# Patient Record
Sex: Female | Born: 1960 | Race: White | Hispanic: No | Marital: Married | Smoking: Former smoker
Health system: Southern US, Community
[De-identification: ages and names within clinical notes are randomized; demographics above are authoritative.]

## PROBLEM LIST (undated history)

## (undated) DIAGNOSIS — D72819 Decreased white blood cell count, unspecified: Secondary | ICD-10-CM

---

## 2018-10-12 ENCOUNTER — Encounter (HOSPITAL_BASED_OUTPATIENT_CLINIC_OR_DEPARTMENT_OTHER): Payer: Self-pay | Admitting: Adult Health

## 2018-10-12 ENCOUNTER — Other Ambulatory Visit: Payer: Self-pay

## 2018-10-12 ENCOUNTER — Emergency Department (HOSPITAL_BASED_OUTPATIENT_CLINIC_OR_DEPARTMENT_OTHER)
Admission: EM | Admit: 2018-10-12 | Discharge: 2018-10-12 | Disposition: A | Discharge status: Home / Self Care | Payer: Commercial Managed Care - PPO | Attending: Emergency Medicine | Admitting: Emergency Medicine

## 2018-10-12 ENCOUNTER — Emergency Department (HOSPITAL_BASED_OUTPATIENT_CLINIC_OR_DEPARTMENT_OTHER): Payer: Commercial Managed Care - PPO

## 2018-10-12 DIAGNOSIS — Y998 Other external cause status: Secondary | ICD-10-CM | POA: Insufficient documentation

## 2018-10-12 DIAGNOSIS — S022XXA Fracture of nasal bones, initial encounter for closed fracture: Secondary | ICD-10-CM | POA: Diagnosis not present

## 2018-10-12 DIAGNOSIS — S025XXA Fracture of tooth (traumatic), initial encounter for closed fracture: Secondary | ICD-10-CM | POA: Diagnosis not present

## 2018-10-12 DIAGNOSIS — S0993XA Unspecified injury of face, initial encounter: Secondary | ICD-10-CM | POA: Diagnosis present

## 2018-10-12 DIAGNOSIS — S50311A Abrasion of right elbow, initial encounter: Secondary | ICD-10-CM | POA: Diagnosis not present

## 2018-10-12 DIAGNOSIS — S80211A Abrasion, right knee, initial encounter: Secondary | ICD-10-CM | POA: Insufficient documentation

## 2018-10-12 DIAGNOSIS — S60512A Abrasion of left hand, initial encounter: Secondary | ICD-10-CM | POA: Insufficient documentation

## 2018-10-12 DIAGNOSIS — S50312A Abrasion of left elbow, initial encounter: Secondary | ICD-10-CM | POA: Diagnosis not present

## 2018-10-12 DIAGNOSIS — W0110XA Fall on same level from slipping, tripping and stumbling with subsequent striking against unspecified object, initial encounter: Secondary | ICD-10-CM | POA: Diagnosis not present

## 2018-10-12 DIAGNOSIS — Z23 Encounter for immunization: Secondary | ICD-10-CM | POA: Insufficient documentation

## 2018-10-12 DIAGNOSIS — Y929 Unspecified place or not applicable: Secondary | ICD-10-CM | POA: Insufficient documentation

## 2018-10-12 DIAGNOSIS — S80212A Abrasion, left knee, initial encounter: Secondary | ICD-10-CM | POA: Diagnosis not present

## 2018-10-12 DIAGNOSIS — S52125A Nondisplaced fracture of head of left radius, initial encounter for closed fracture: Secondary | ICD-10-CM | POA: Insufficient documentation

## 2018-10-12 DIAGNOSIS — S01512A Laceration without foreign body of oral cavity, initial encounter: Secondary | ICD-10-CM | POA: Insufficient documentation

## 2018-10-12 DIAGNOSIS — Y9302 Activity, running: Secondary | ICD-10-CM | POA: Diagnosis not present

## 2018-10-12 DIAGNOSIS — T07XXXA Unspecified multiple injuries, initial encounter: Secondary | ICD-10-CM

## 2018-10-12 HISTORY — DX: Decreased white blood cell count, unspecified: D72.819

## 2018-10-12 MED ORDER — TETANUS-DIPHTH-ACELL PERTUSSIS 5-2.5-18.5 LF-MCG/0.5 IM SUSP
0.5000 mL | Freq: Once | INTRAMUSCULAR | Status: AC
  Administered 2018-10-12: 0.5 mL via INTRAMUSCULAR
  Filled 2018-10-12: qty 0.5

## 2018-10-12 MED ORDER — BACITRACIN ZINC 500 UNIT/GM EX OINT
1.0000 "application " | TOPICAL_OINTMENT | Freq: Two times a day (BID) | CUTANEOUS | Status: DC
  Filled 2018-10-12: qty 28.35

## 2018-10-12 NOTE — ED Provider Notes (Signed)
[facility] EMERGENCY DEPARTMENT Provider Note   CSN: [id number] Arrival date & time: 10/12/18  1149    History   Chief Complaint Chief Complaint  Patient presents with  . Fall    HPI Christina Ibarra is a 58 y.o. female.     58-year-old female with past medical history including chronic leukopenia who presents with fall and facial injuries.  Just prior to arrival, the patient was running and she tripped and fell forward, striking her face, knees, and arms.  She did not lose consciousness.  Her nose bled initially but has stopped.  She reports pain in her nose as well as mild pain in her left wrist and proximal forearm.  She has been able to ambulate with no problems since the event.  No vomiting or confusion.  Unknown last tetanus vaccination.  The history is provided by the patient.  Fall    Past Medical History:  Diagnosis Date  . Chronic leukopenia     There are no active problems to display for this patient.   History reviewed. No pertinent surgical history.   OB History   No obstetric history on file.      Home Medications    Prior to Admission medications   Medication Sig Start Date End Date Taking? Authorizing Provider  acetaminophen (TYLENOL) 325 MG tablet Take 650 mg by mouth every 6 (six) hours as needed.   Yes [provider]    Family History History reviewed. No pertinent family history.  Social History Social History   Tobacco Use  . Smoking status: Former Games developermoker  . Smokeless tobacco: Never Used  Substance Use Topics  . Alcohol use: Never    Frequency: Never  . Drug use: Never     Allergies   Patient has no known allergies.   Review of Systems Review of Systems All other systems reviewed and are negative except that which was mentioned in HPI   Physical Exam Updated Vital Signs BP (!) 165/89   Pulse 66   Temp 98.4 F (36.9 C) (Oral)   Resp 18   Ht 5\' 2"  (1.575 m)   Wt 57.5 kg   SpO2 99%   BMI 23.19 kg/m   Physical Exam Vitals signs and nursing note reviewed.  Constitutional:      General: She is not in acute distress.    Appearance: She is well-developed.  HENT:     Head: Normocephalic.     Comments: Swelling and ecchymosis nasal bridge w/ dried blood in nares; small laceration under upper lip involving mucosa only; chipped central bottom tooth and chipped right central incisor with no exposure of dentin or pulp    Right Ear: Tympanic membrane and ear canal normal.     Left Ear: Tympanic membrane and ear canal normal.     Mouth/Throat:     Mouth: Mucous membranes are moist.     Pharynx: Oropharynx is clear.  Eyes:     Conjunctiva/sclera: Conjunctivae normal.     Pupils: Pupils are equal, round, and reactive to light.  Neck:     Musculoskeletal: Neck supple.  Cardiovascular:     Pulses: Normal pulses.  Pulmonary:     Effort: Pulmonary effort is normal.  Musculoskeletal: Normal range of motion.        General: No deformity.     Comments: Mild tenderness radial side of L wrist and over L proximal forearm, normal ROM w/ normal pronation/supination  Skin:    General: Skin is warm  and dry.     Comments: Small insect bite L neck; abrasions b/l knees, b/l elbows, L palm on thenar eminence  Neurological:     Mental Status: She is alert and oriented to person, place, and time.  Psychiatric:        Judgment: Judgment normal.      ED Treatments / Results  Labs (all labs ordered are listed, but only abnormal results are displayed) Labs Reviewed - No data to display  EKG None  Radiology Dg Elbow Complete Left  Result Date: 10/12/2018 CLINICAL DATA:  Fall, pain EXAM: LEFT WRIST - COMPLETE 3+ VIEW; LEFT ELBOW - COMPLETE 3+ VIEW COMPARISON:  None. FINDINGS: No fracture or dislocation of the left wrist. The carpus is normally aligned. Joint spaces are well preserved. There is a subtle cortical irregularity of the left radial neck best appreciated on frontal view with a large associated  elbow joint effusion. No other evidence of fracture. The joint spaces are well preserved. IMPRESSION: 1. No fracture or dislocation of the left wrist. The carpus is normally aligned. Joint spaces are well preserved. 2. There is a subtle cortical irregularity of the left radial neck best appreciated on frontal view with a large associated elbow joint effusion. Findings are consistent with a subtle fracture of the radial neck. No other evidence of fracture. The joint spaces are well preserved. Electronically Signed   By: Eddie Candle M.D.   On: 10/12/2018 14:33   Dg Wrist Complete Left  Result Date: 10/12/2018 CLINICAL DATA:  Fall, pain EXAM: LEFT WRIST - COMPLETE 3+ VIEW; LEFT ELBOW - COMPLETE 3+ VIEW COMPARISON:  None. FINDINGS: No fracture or dislocation of the left wrist. The carpus is normally aligned. Joint spaces are well preserved. There is a subtle cortical irregularity of the left radial neck best appreciated on frontal view with a large associated elbow joint effusion. No other evidence of fracture. The joint spaces are well preserved. IMPRESSION: 1. No fracture or dislocation of the left wrist. The carpus is normally aligned. Joint spaces are well preserved. 2. There is a subtle cortical irregularity of the left radial neck best appreciated on frontal view with a large associated elbow joint effusion. Findings are consistent with a subtle fracture of the radial neck. No other evidence of fracture. The joint spaces are well preserved. Electronically Signed   By: Eddie Candle M.D.   On: 10/12/2018 14:33   Ct Head Wo Contrast  Result Date: 10/12/2018 CLINICAL DATA:  Fall, head and facial injury, headache EXAM: CT HEAD WITHOUT CONTRAST CT MAXILLOFACIAL WITHOUT CONTRAST CT CERVICAL SPINE WITHOUT CONTRAST TECHNIQUE: Multidetector CT imaging of the head, cervical spine, and maxillofacial structures were performed using the standard protocol without intravenous contrast. Multiplanar CT image  reconstructions of the cervical spine and maxillofacial structures were also generated. COMPARISON:  None. FINDINGS: CT HEAD FINDINGS Brain: No evidence of acute infarction, hemorrhage, hydrocephalus, extra-axial collection or mass lesion/mass effect. 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 Vascular: No hyperdense vessel or unexpected calcification. CT FACIAL BONES FINDINGS Skull: Normal. Negative for fracture or focal lesion. Facial bones: Minimally displaced fractures of the nasal bones (series 7, image 44). No other displaced fractures or dislocations. Sinuses/Orbits: No acute finding. Other: Soft tissue edema of the nose and lips. CT CERVICAL SPINE FINDINGS Alignment: Normal. Skull base and vertebrae: No acute fracture. No primary bone lesion or focal pathologic process. Soft tissues and spinal canal: No prevertebral fluid or swelling. No visible canal hematoma.  Disc levels: Mild multilevel disc space height loss and osteophytosis. Upper chest: Negative. Other: None. IMPRESSION: 1.  No acute intracranial pathology. 2. Minimally displaced fractures of the nasal bones (series 7, image 44). No other displaced fractures or dislocations of the facial bones. 3.  No fracture or static subluxation of the cervical spine. Electronically Signed   By: Lauralyn PrimesAlex  Bibbey M.D.   On: 10/12/2018 13:06   Ct Cervical Spine Wo Contrast  Result Date: 10/12/2018 CLINICAL DATA:  Fall, head and facial injury, headache EXAM: CT HEAD WITHOUT CONTRAST CT MAXILLOFACIAL WITHOUT CONTRAST CT CERVICAL SPINE WITHOUT CONTRAST TECHNIQUE: Multidetector CT imaging of the head, cervical spine, and maxillofacial structures were performed using the standard protocol without intravenous contrast. Multiplanar CT image reconstructions of the cervical spine and maxillofacial structures were also generated. COMPARISON:  None. FINDINGS: CT HEAD FINDINGS Brain: No evidence of acute infarction, hemorrhage, hydrocephalus,  extra-axial collection or mass lesion/mass effect. 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 Vascular: No hyperdense vessel or unexpected calcification. CT FACIAL BONES FINDINGS Skull: Normal. Negative for fracture or focal lesion. Facial bones: Minimally displaced fractures of the nasal bones (series 7, image 44). No other displaced fractures or dislocations. Sinuses/Orbits: No acute finding. Other: Soft tissue edema of the nose and lips. CT CERVICAL SPINE FINDINGS Alignment: Normal. Skull base and vertebrae: No acute fracture. No primary bone lesion or focal pathologic process. Soft tissues and spinal canal: No prevertebral fluid or swelling. No visible canal hematoma. Disc levels: Mild multilevel disc space height loss and osteophytosis. Upper chest: Negative. Other: None. IMPRESSION: 1.  No acute intracranial pathology. 2. Minimally displaced fractures of the nasal bones (series 7, image 44). No other displaced fractures or dislocations of the facial bones. 3.  No fracture or static subluxation of the cervical spine. Electronically Signed   By: Lauralyn PrimesAlex  Bibbey M.D.   On: 10/12/2018 13:06   Ct Maxillofacial Wo Contrast  Result Date: 10/12/2018 CLINICAL DATA:  Fall, head and facial injury, headache EXAM: CT HEAD WITHOUT CONTRAST CT MAXILLOFACIAL WITHOUT CONTRAST CT CERVICAL SPINE WITHOUT CONTRAST TECHNIQUE: Multidetector CT imaging of the head, cervical spine, and maxillofacial structures were performed using the standard protocol without intravenous contrast. Multiplanar CT image reconstructions of the cervical spine and maxillofacial structures were also generated. COMPARISON:  None. FINDINGS: CT HEAD FINDINGS Brain: No evidence of acute infarction, hemorrhage, hydrocephalus, extra-axial collection or mass lesion/mass effect. 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 Vascular: No hyperdense vessel or unexpected calcification. CT FACIAL  BONES FINDINGS Skull: Normal. Negative for fracture or focal lesion. Facial bones: Minimally displaced fractures of the nasal bones (series 7, image 44). No other displaced fractures or dislocations. Sinuses/Orbits: No acute finding. Other: Soft tissue edema of the nose and lips. CT CERVICAL SPINE FINDINGS Alignment: Normal. Skull base and vertebrae: No acute fracture. No primary bone lesion or focal pathologic process. Soft tissues and spinal canal: No prevertebral fluid or swelling. No visible canal hematoma. Disc levels: Mild multilevel disc space height loss and osteophytosis. Upper chest: Negative. Other: None. IMPRESSION: 1.  No acute intracranial pathology. 2. Minimally displaced fractures of the nasal bones (series 7, image 44). No other displaced fractures or dislocations of the facial bones. 3.  No fracture or static subluxation of the cervical spine. Electronically Signed   By: Lauralyn PrimesAlex  Bibbey M.D.   On: 10/12/2018 13:06    Procedures Procedures (including critical care time)  Medications Ordered in ED Medications  bacitracin ointment  1 application (1 application Topical Refused 10/12/18 1408)  Tdap (BOOSTRIX) injection 0.5 mL (0.5 mLs Intramuscular Given 10/12/18 1407)     Initial Impression / Assessment and Plan / ED Course  I have reviewed the triage vital signs and the nursing notes.  Pertinent imaging results that were available during my care of the patient were reviewed by me and considered in my medical decision making (see chart for details).       Well-appearing on exam.  CT head and C-spine reassuring.  Maxillofacial CT shows minimally displaced fracture of nasal bones.  Updated tetanus and discussed supportive measures for nasal bone fractures as well as ENT follow-up.  Patient has already contacted dentist for follow-up.  Discussed supportive measures for tooth injuries and mouth laceration.  XR L arm shows possible L radial neck fx, which is c/w exam. Will place in sling  and give ortho f/u info.   Return precautions reviewed.  Final Clinical Impressions(s) / ED Diagnoses   Final diagnoses:  Closed fracture of nasal bone, initial encounter  Multiple abrasions  Closed fracture of tooth, initial encounter  Laceration of internal mouth, initial encounter  Closed nondisplaced fracture of head of left radius, initial encounter    ED Discharge Orders    None       , Ambrose Finlandachel Morgan, MD 10/12/18 [phone]

## 2018-10-12 NOTE — ED Triage Notes (Signed)
While running today pt had a foosh injury as well as hit her face on thoncrete. SHe has significant bruising to her nose and has some broken teeth and a laceration inside her mouth. She also has pain with pronation of left wrist.

## 2020-11-06 IMAGING — DX LEFT WRIST - COMPLETE 3+ VIEW
4 series · 4 of 4 positions shown · non-contrast
Comparison: None.

CLINICAL DATA: Fall, pain

EXAM:
LEFT WRIST - COMPLETE 3+ VIEW; LEFT ELBOW - COMPLETE 3+ VIEW

[wrist ap]
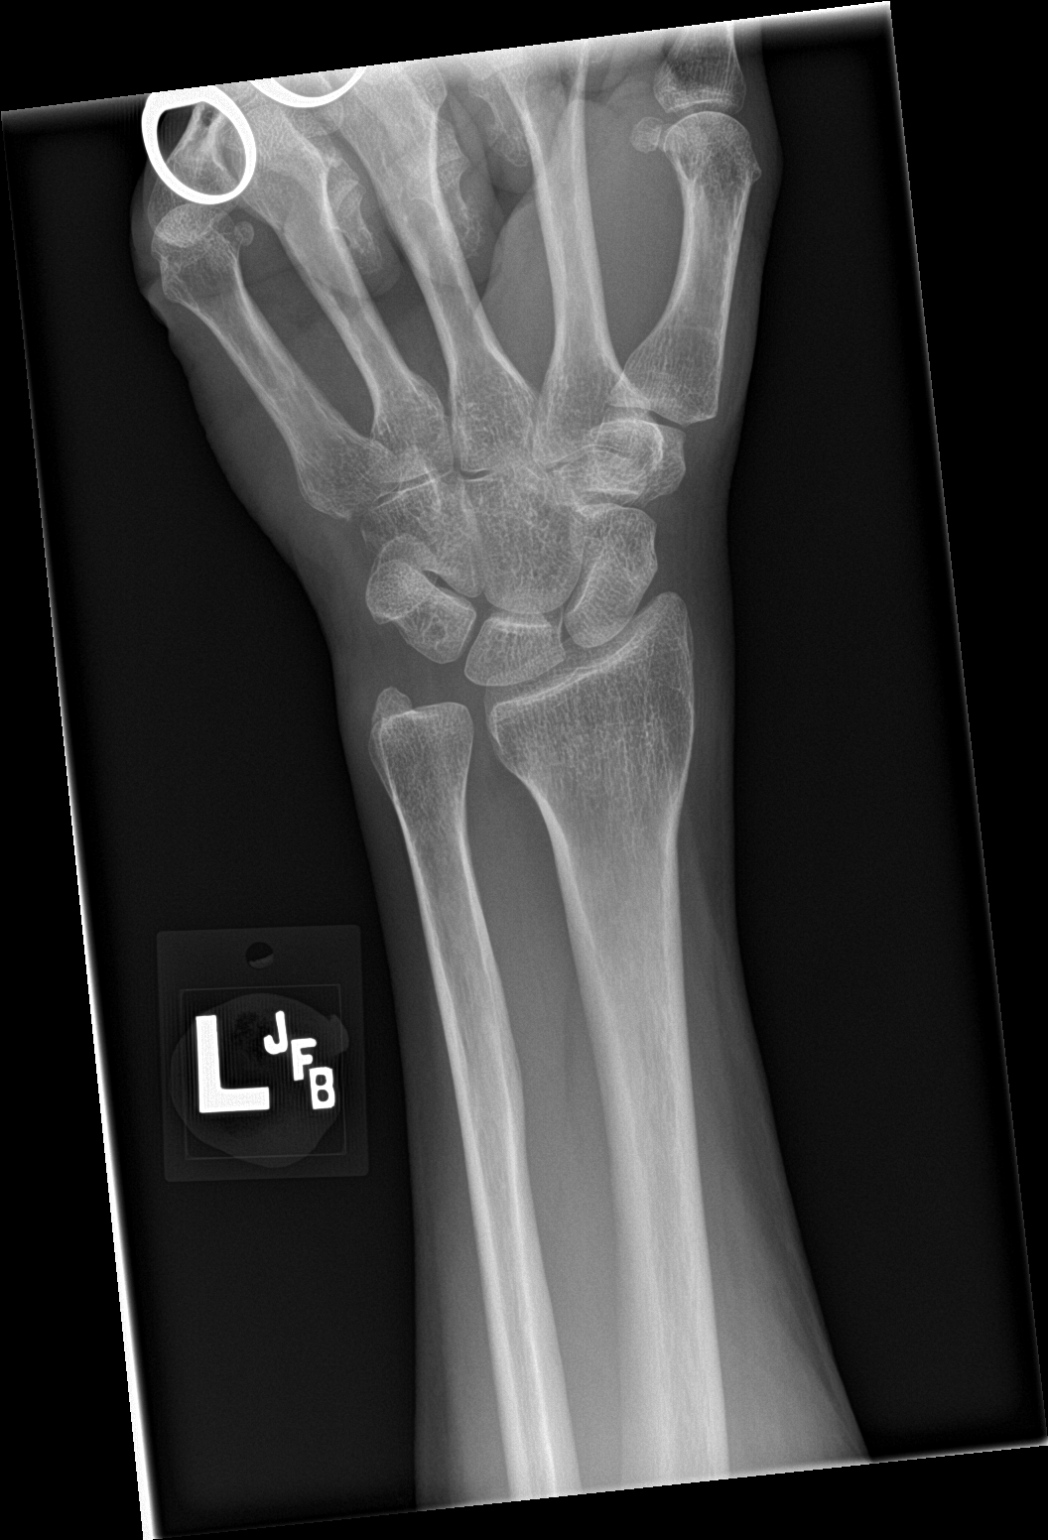

[wrist obl]
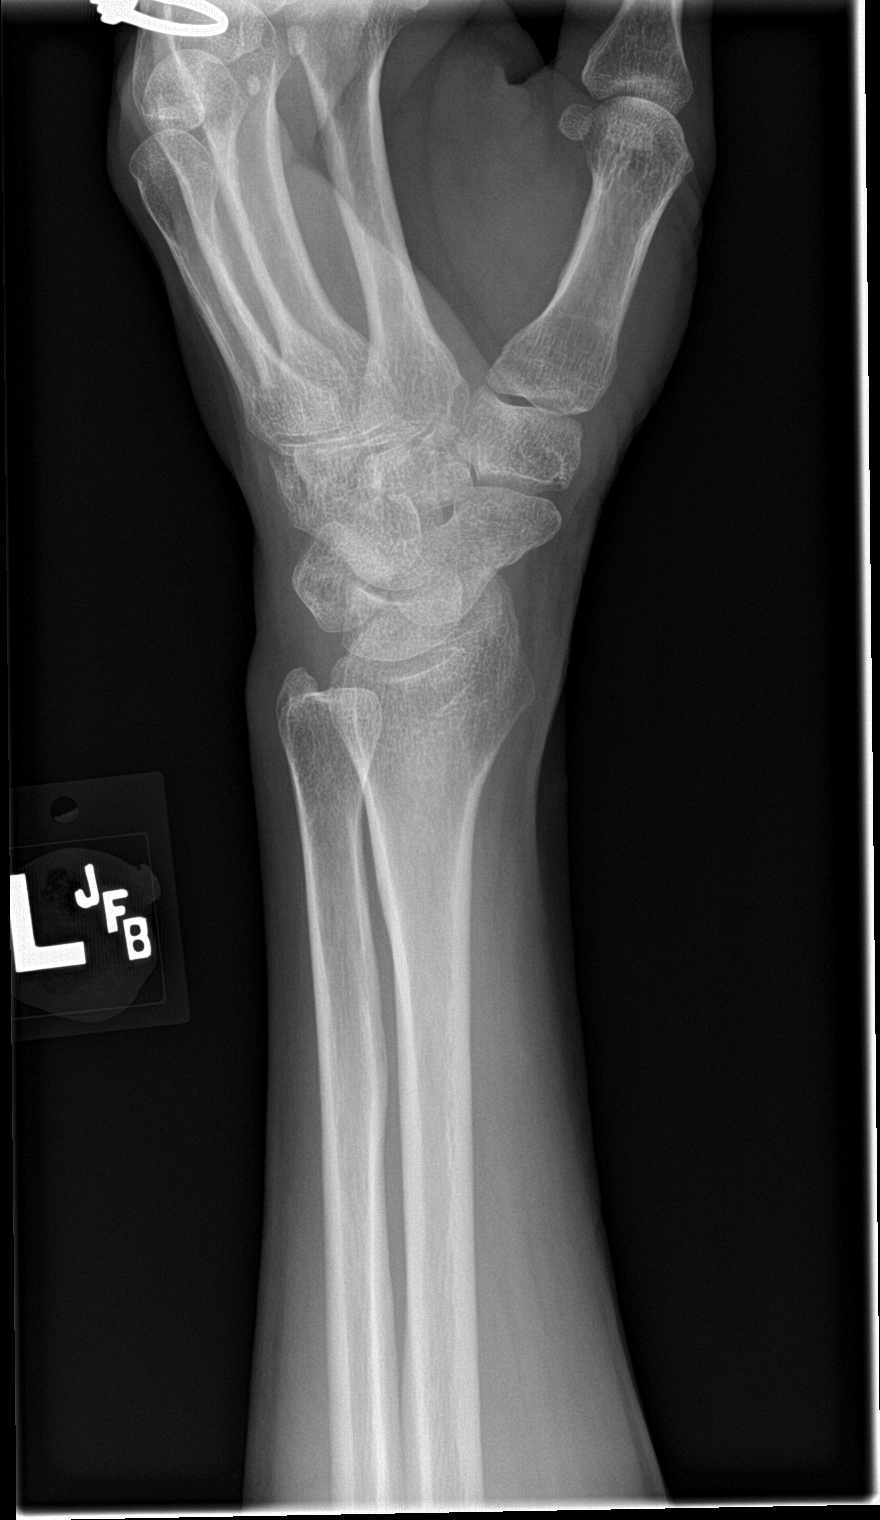

[wrist tunnel (1 of 2)]
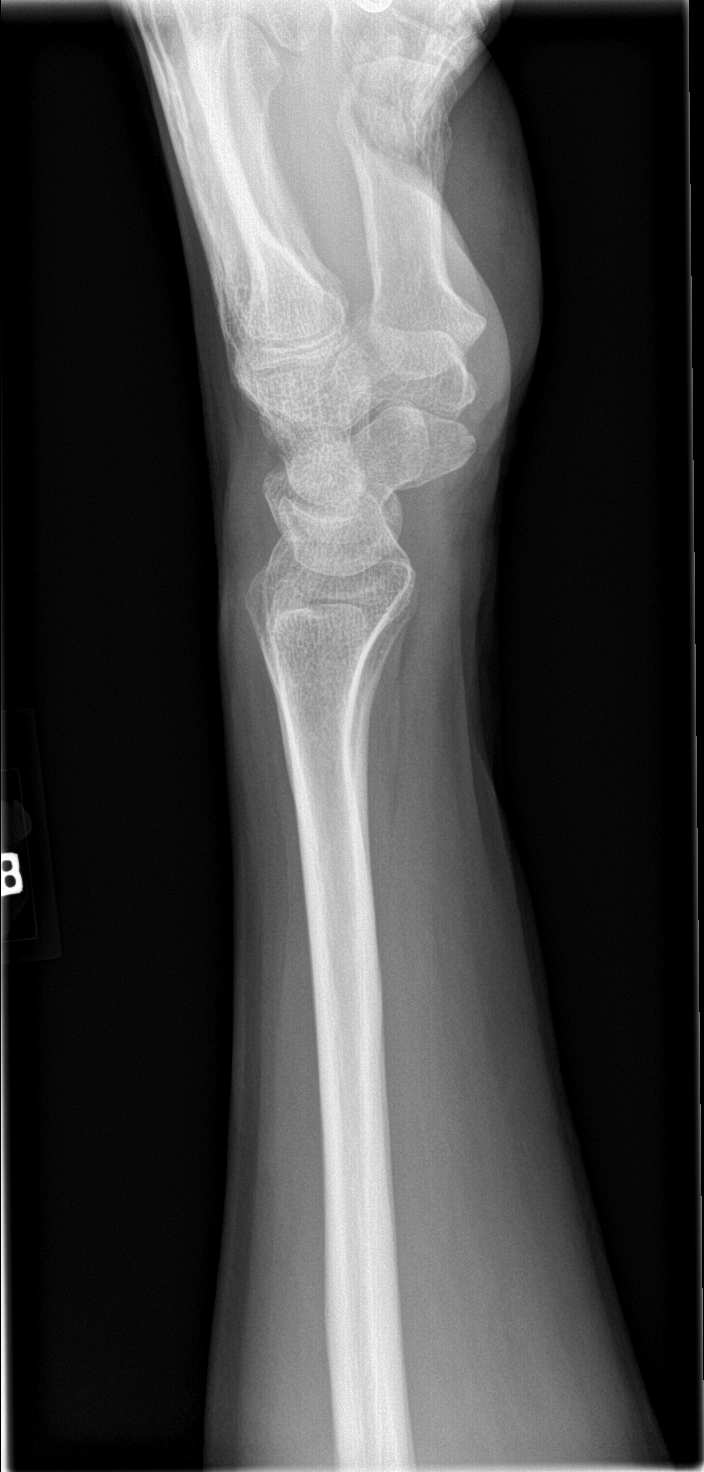

[wrist tunnel (2 of 2)]
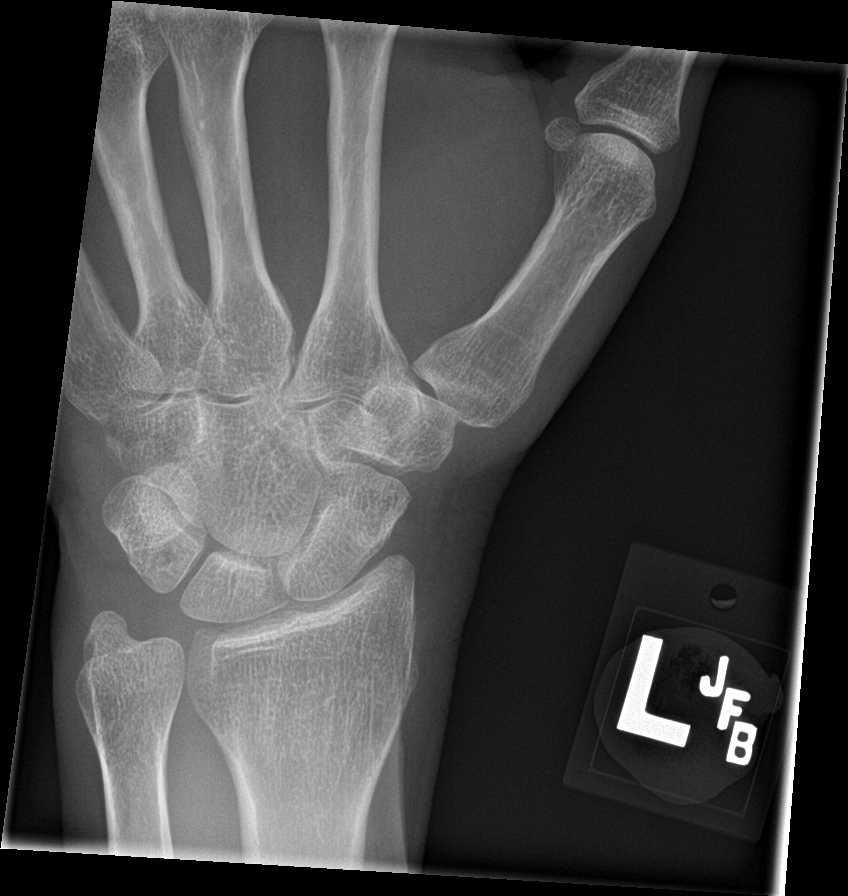

[4 of 4 positions shown; findings below may reference images not displayed]

FINDINGS: No fracture or dislocation of the left wrist. The carpus is normally
aligned. Joint spaces are well preserved.

There is a subtle cortical irregularity of the left radial neck best
appreciated on frontal view with a large associated elbow joint
effusion. No other evidence of fracture. The joint spaces are well
preserved.
IMPRESSION: 1. No fracture or dislocation of the left wrist. The carpus is
normally aligned. Joint spaces are well preserved.

2. There is a subtle cortical irregularity of the left radial neck
best appreciated on frontal view with a large associated elbow joint
effusion. Findings are consistent with a subtle fracture of the
radial neck. No other evidence of fracture. The joint spaces are
well preserved.
